# Patient Record
Sex: Male | Born: 1990 | Race: White | Hispanic: No | Marital: Single | Smoking: Never smoker
Health system: Southern US, Community
[De-identification: ages and names within clinical notes are randomized; demographics above are authoritative.]

---
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
## 2011-05-30 ENCOUNTER — Ambulatory Visit (INDEPENDENT_AMBULATORY_CARE_PROVIDER_SITE_OTHER): Payer: BC Managed Care – PPO

## 2011-05-30 DIAGNOSIS — J159 Unspecified bacterial pneumonia: Secondary | ICD-10-CM

## 2011-05-30 DIAGNOSIS — G4700 Insomnia, unspecified: Secondary | ICD-10-CM

## 2014-11-27 ENCOUNTER — Encounter: Payer: Self-pay | Admitting: *Deleted

## 2014-11-27 ENCOUNTER — Telehealth: Payer: Self-pay | Admitting: *Deleted

## 2014-11-27 ENCOUNTER — Emergency Department
Admission: EM | Admit: 2014-11-27 | Discharge: 2014-11-27 | Disposition: A | Discharge status: Home / Self Care | Payer: BC Managed Care – PPO | Source: Home / Self Care | Attending: Family Medicine | Admitting: Family Medicine

## 2014-11-27 DIAGNOSIS — R1310 Dysphagia, unspecified: Secondary | ICD-10-CM

## 2014-11-27 DIAGNOSIS — R1319 Other dysphagia: Secondary | ICD-10-CM

## 2014-11-27 DIAGNOSIS — R1314 Dysphagia, pharyngoesophageal phase: Secondary | ICD-10-CM

## 2014-11-27 MED ORDER — SUCRALFATE 1 GM/10ML PO SUSP: 1.0000 g | Freq: Three times a day (TID) | ORAL | Status: DC

## 2014-11-27 NOTE — Discharge Instructions (Signed)

## 2014-11-27 NOTE — ED Notes (Signed)
Pt c/o dysphagia x 2 weeks. Post nasal drainage x 2 months. Denies SOB or dyspnea.

## 2014-11-27 NOTE — ED Provider Notes (Signed)
CSN: [id number]     Arrival date & time 11/27/14  1108 History   First MD Initiated Contact with Patient 11/27/14 1146     Chief Complaint  Patient presents with  . Dysphagia      HPI Comments: Patient complains of difficulty swallowing solids for about one month.  About two weeks ago he was unsable to initially swallow a piece of steak, but succeeded after drinking some fluids.  Yesterday he had significant difficulty swallowing an almond.  He has no difficulty initiating swallowing, and food seems to become lodged further down.  He states that for about 4 to 5 weeks he has had to frequently "clear his throat," and he has a morning cough and sensation of phlegm in his throat upon awakening.  He denies nausea/vomiting and abdominal pain.  No shortness of breath.  His bowel movements have been normal  The history is provided by the patient.    History reviewed. No pertinent past medical history. History reviewed. No pertinent past surgical history. History reviewed. No pertinent family history. History  Substance Use Topics  . Smoking status: Never Smoker   . Smokeless tobacco: Never Used  . Alcohol Use: Yes    Review of Systems  Constitutional: Negative for fever, chills, diaphoresis, appetite change, fatigue and unexpected weight change.  HENT: Positive for sore throat. Negative for congestion, postnasal drip and sinus pressure.   Eyes: Negative.   Respiratory: Negative.   Cardiovascular: Negative.   Gastrointestinal: Negative for nausea, vomiting, abdominal pain, diarrhea, constipation, blood in stool and abdominal distention.  Genitourinary: Negative.  Negative for penile swelling.  Musculoskeletal: Negative.   Skin: Negative.   Neurological: Negative for headaches.  Hematological: Negative for adenopathy.    Allergies  Review of patient's allergies indicates no known allergies.  Home Medications   Prior to Admission medications   Medication Sig Start Date End Date  Taking? Authorizing Provider  sucralfate (CARAFATE) 1 GM/10ML suspension Take 10 mLs (1 g total) by mouth 4 (four) times daily -  with meals and at bedtime. 11/27/14   Lattie HawStephen A Tanaja Ganger, MD   BP 117/83 mmHg  Pulse 57  Temp(Src) 98.3 F (36.8 C) (Oral)  Resp 14  Ht 5\' 6"  (1.676 m)  Wt 198 lb (89.812 kg)  BMI 31.97 kg/m2  SpO2 98% Physical Exam Nursing notes and Vital Signs reviewed. Appearance:  Patient appears stated age, and in no acute distress Eyes:  Pupils are equal, round, and reactive to light and accomodation.  Extraocular movement is intact.  Conjunctivae are not inflamed  Ears:  Canals normal.  Tympanic membranes normal.  Nose:   Normal turbinates.  No sinus tenderness.    Mouth/Pharynx:  Normal Neck:  Supple.  No adenopathy or thyromegaly Lungs:  Clear to auscultation.  Breath sounds are equal.  Moving air well. Heart:  Regular rate and rhythm without murmurs, rubs, or gallops.  Abdomen:  Nontender without masses or hepatosplenomegaly.  Bowel sounds are present.  No CVA or flank tenderness.  Extremities:  No edema.  No calf tenderness Skin:  No rash present.    ED Course  Procedures  none   MDM   1. Esophageal dysphagia    Begin Carafate 10mL QID (AC and HS) Followup with gastroenterologist for definitive evaluation and treatment.    Lattie HawStephen A Lankford Gutzmer, MD 11/28/14 2351

## 2014-12-03 ENCOUNTER — Other Ambulatory Visit: Payer: Self-pay | Admitting: Physician Assistant

## 2014-12-03 DIAGNOSIS — R1310 Dysphagia, unspecified: Secondary | ICD-10-CM

## 2014-12-05 ENCOUNTER — Ambulatory Visit
Admission: RE | Admit: 2014-12-05 | Discharge: 2014-12-05 | Disposition: A | Discharge status: Home / Self Care | Payer: BC Managed Care – PPO | Source: Ambulatory Visit | Attending: Physician Assistant | Admitting: Physician Assistant

## 2014-12-05 DIAGNOSIS — R1310 Dysphagia, unspecified: Secondary | ICD-10-CM

## 2016-05-05 ENCOUNTER — Encounter: Payer: Self-pay | Admitting: Emergency Medicine

## 2016-05-05 ENCOUNTER — Emergency Department (INDEPENDENT_AMBULATORY_CARE_PROVIDER_SITE_OTHER)
Admission: EM | Admit: 2016-05-05 | Discharge: 2016-05-05 | Disposition: A | Discharge status: Home / Self Care | Payer: BLUE CROSS/BLUE SHIELD | Source: Home / Self Care | Attending: Family Medicine | Admitting: Family Medicine

## 2016-05-05 DIAGNOSIS — J029 Acute pharyngitis, unspecified: Secondary | ICD-10-CM

## 2016-05-05 LAB — POCT RAPID STREP A (OFFICE): Rapid Strep A Screen: NEGATIVE

## 2016-05-05 NOTE — ED Triage Notes (Signed)
Sore throat, fever, chills, headache slight nausea x 3 days

## 2016-05-05 NOTE — ED Provider Notes (Signed)
CSN: [id number]     Arrival date & time 05/05/16  1107 History   First MD Initiated Contact with Patient 05/05/16 1235     Chief Complaint  Patient presents with  . Sore Throat   (Consider location/radiation/quality/duration/timing/severity/associated sxs/prior Treatment) HPI  Shawn Palmer is a 25 y.o. male presenting to UC with c/o 3 days of gradually worsening sore throat with fever, chills, generalized headache and slight nausea.  Sore throat and headache are most bothersome for pt.  Throat pain is 4/10, worse with swallowing.  He has taken acetaminophen with mild temporary relief.  Pt's mother notes he was in a church activity with another girl recently who had the flu.  Pt denies cough or congestion. Denies chest pain or SOB. No hx of asthma or COPD. He did not get the flu vaccine this season.    History reviewed. No pertinent past medical history. History reviewed. No pertinent surgical history. No family history on file. Social History  Substance Use Topics  . Smoking status: Never Smoker  . Smokeless tobacco: Never Used  . Alcohol use Yes    Review of Systems  Constitutional: Positive for chills and fever.  HENT: Positive for sore throat. Negative for congestion, ear pain, trouble swallowing and voice change.   Respiratory: Negative for cough and shortness of breath.   Cardiovascular: Negative for chest pain and palpitations.  Gastrointestinal: Negative for abdominal pain, diarrhea, nausea and vomiting.  Musculoskeletal: Negative for arthralgias, back pain and myalgias.  Skin: Negative for rash.  Neurological: Positive for headaches. Negative for dizziness and light-headedness.    Allergies  Patient has no known allergies.  Home Medications   Prior to Admission medications   Medication Sig Start Date End Date Taking? Authorizing Provider  acetaminophen (TYLENOL) 325 MG tablet Take 650 mg by mouth every 6 (six) hours as needed.   Yes Historical Provider, MD   omeprazole (PRILOSEC) 10 MG capsule Take 10 mg by mouth daily.   Yes Historical Provider, MD  sucralfate (CARAFATE) 1 GM/10ML suspension Take 10 mLs (1 g total) by mouth 4 (four) times daily -  with meals and at bedtime. 11/27/14   Lattie HawStephen A Beese, MD   Meds Ordered and Administered this Visit  Medications - No data to display  BP 116/73 (BP Location: Left Arm)   Pulse 86   Temp 98.3 F (36.8 C) (Oral)   Ht 5\' 6"  (1.676 m)   Wt 206 lb (93.4 kg)   SpO2 99%   BMI 33.25 kg/m  No data found.   Physical Exam  Constitutional: He appears well-developed and well-nourished. No distress.  HENT:  Head: Normocephalic and atraumatic.  Right Ear: Tympanic membrane normal.  Left Ear: Tympanic membrane normal.  Nose: Nose normal.  Mouth/Throat: Uvula is midline and mucous membranes are normal. Posterior oropharyngeal erythema present. No oropharyngeal exudate, posterior oropharyngeal edema or tonsillar abscesses.  Eyes: Conjunctivae are normal. No scleral icterus.  Neck: Normal range of motion. Neck supple.  Cardiovascular: Normal rate, regular rhythm and normal heart sounds.   Pulmonary/Chest: Effort normal and breath sounds normal. No stridor. No respiratory distress. He has no wheezes. He has no rales.  Musculoskeletal: Normal range of motion.  Lymphadenopathy:    He has no cervical adenopathy.  Neurological: He is alert.  Skin: Skin is warm and dry. He is not diaphoretic.  Nursing note and vitals reviewed.   Urgent Care Course   Clinical Course     Procedures (including critical care time)  Labs Review Labs Reviewed  POCT RAPID STREP A (OFFICE)    Imaging Review No results found.   MDM   1. Pharyngitis, unspecified etiology    Pt c/o sore throat, headache and chills. Possible exposure to the flu.  Rapid strep: Negative Will send culture.  Discussed pt being outside the recommended 48 hours for treatment with tamiflu.  Symptoms likely viral. Encouraged symptomatic  treatment with plenty of fluids and rest. F/u with PCP in 1 week if not improving, sooner if worsening. Work note provided for today and tomorrow.    Junius Finnerrin O'Malley, PA-C 05/05/16 1408

## 2016-08-14 IMAGING — RF DG ESOPHAGUS
16 of 19 series · 19 of 24 positions shown · non-contrast
Comparison: None.

CLINICAL DATA: Dysphagia

EXAM:
ESOPHOGRAM / BARIUM SWALLOW / BARIUM TABLET STUDY
TECHNIQUE: Combined double contrast and single contrast examination performed
using effervescent crystals, thick barium liquid, and thin barium
liquid. The patient was observed with fluoroscopy swallowing a 13 mm
barium sulphate tablet.
FLUOROSCOPY TIME:  Radiation Exposure Index (as provided by the
fluoroscopic device): 40 Gy per sq cm
If the device does not provide the exposure index:
Fluoroscopy Time:  1 minutes 30 seconds
Number of Acquired Images:

[Series 2: run · 1 of 1 slices shown (1 of 16)]
[im 1/1]
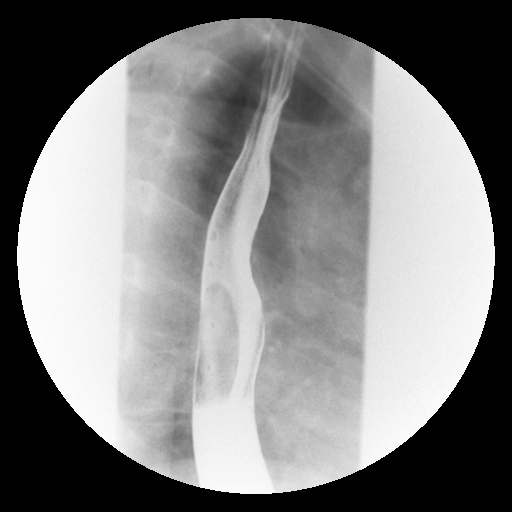

[Series 3: run · 1 of 1 slices shown (2 of 16)]
[im 1/1]
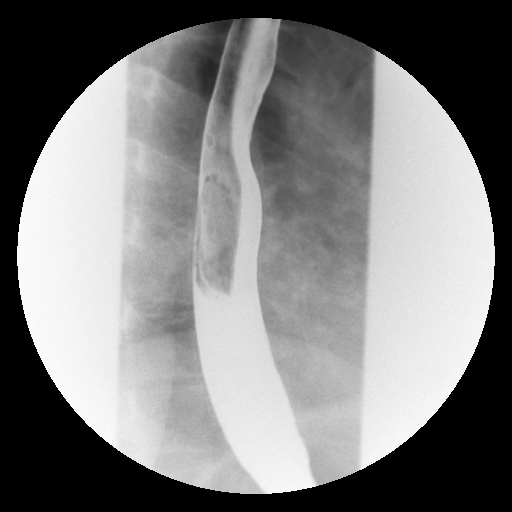

[Series 6: run · 1 of 1 slices shown (3 of 16)]
[im 1/1]
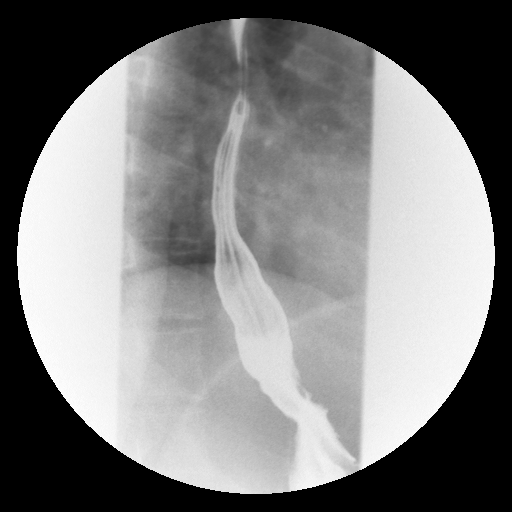

[Series 7: run · 1 of 1 slices shown (4 of 16)]
[im 1/1]
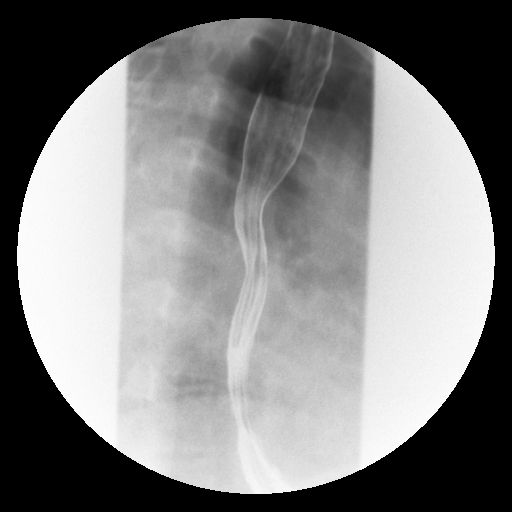

[Series 8: run · 2 of 9 slices shown (5 of 16)]
[im 1/9]
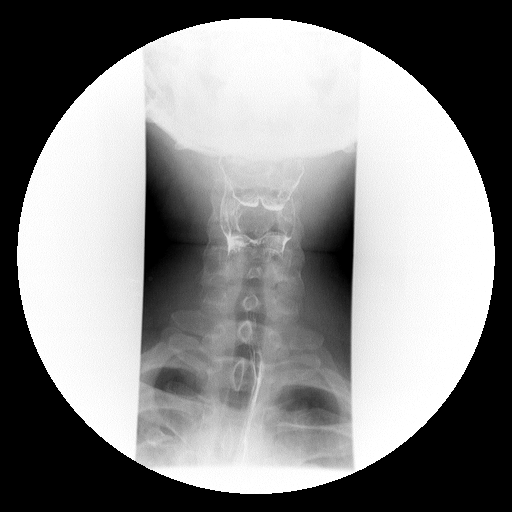
[im 5/9]
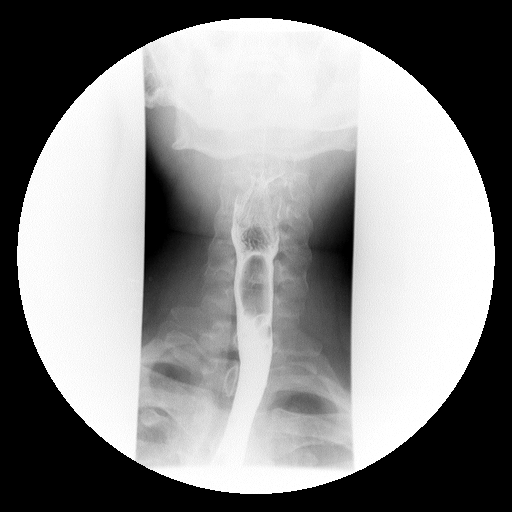

[Series 9: run · 3 of 10 slices shown (6 of 16)]
[im 1/10]
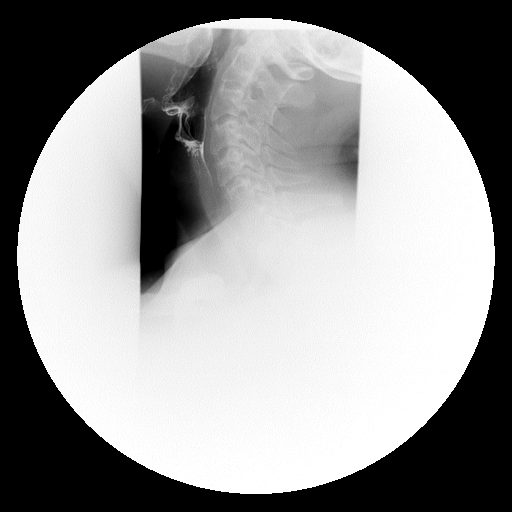
[im 4/10]
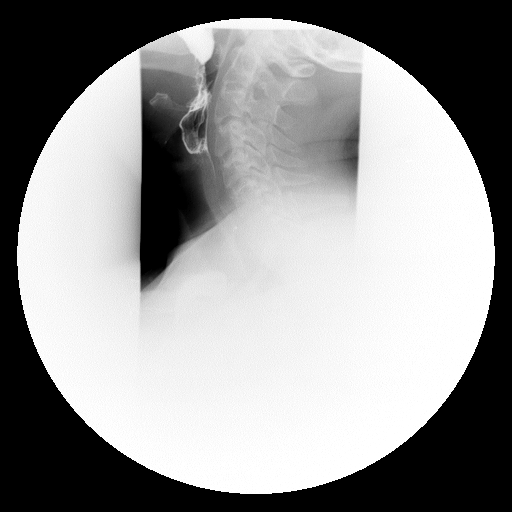
[im 7/10]
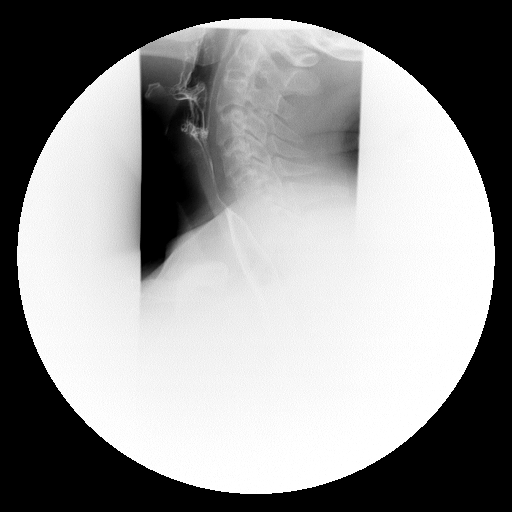

[Series 10: run · 1 of 1 slices shown (7 of 16)]
[im 1/1]
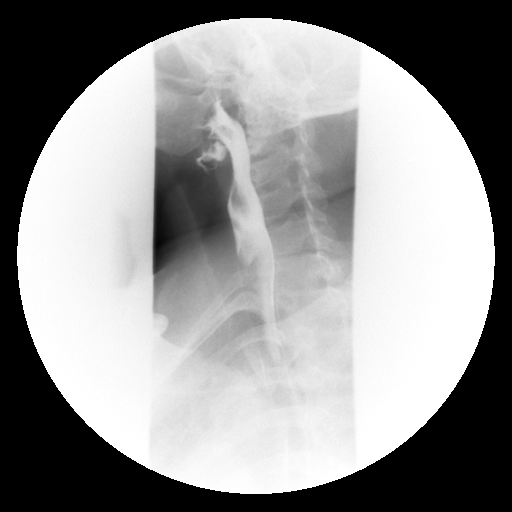

[Series 12: run · 1 of 1 slices shown (8 of 16)]
[im 1/1]
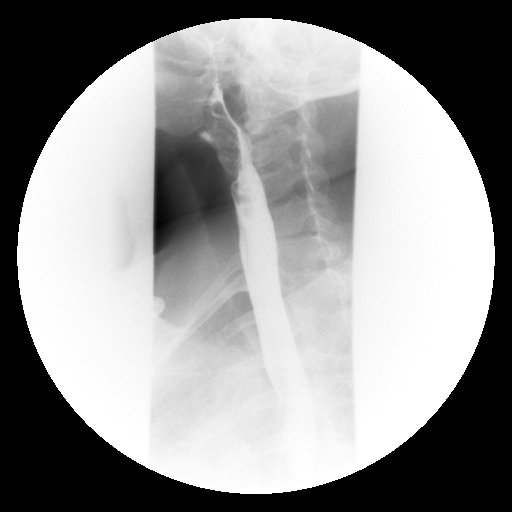

[Series 13: run · 1 of 1 slices shown (9 of 16)]
[im 1/1]
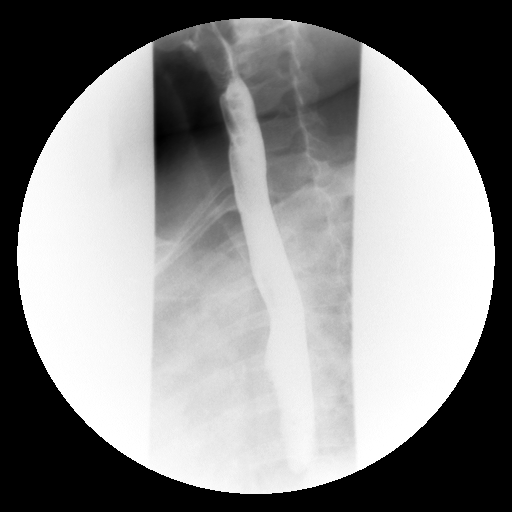

[Series 14: run · 1 of 1 slices shown (10 of 16)]
[im 1/1]
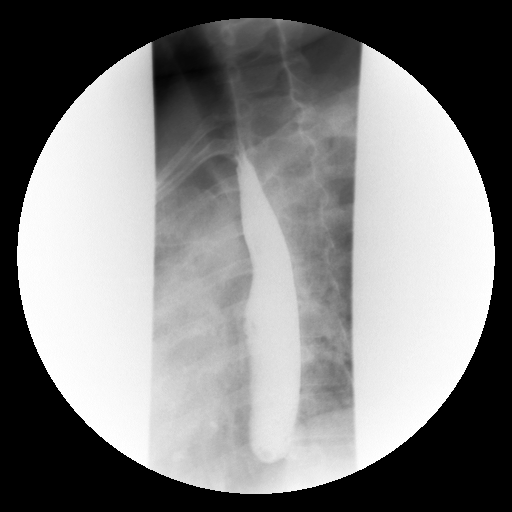

[Series 16: run · 1 of 1 slices shown (11 of 16)]
[im 1/1]
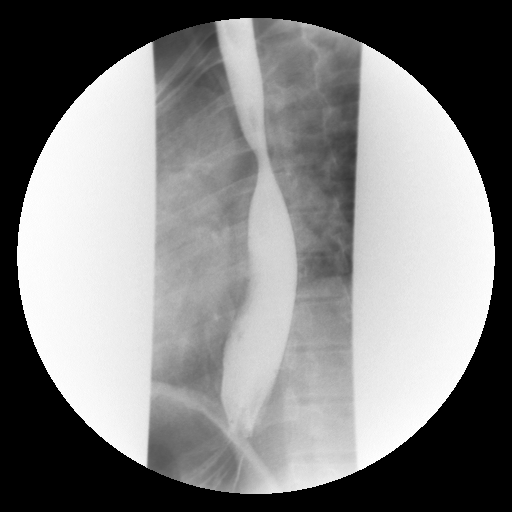

[Series 18: run · 1 of 1 slices shown (12 of 16)]
[im 1/1]
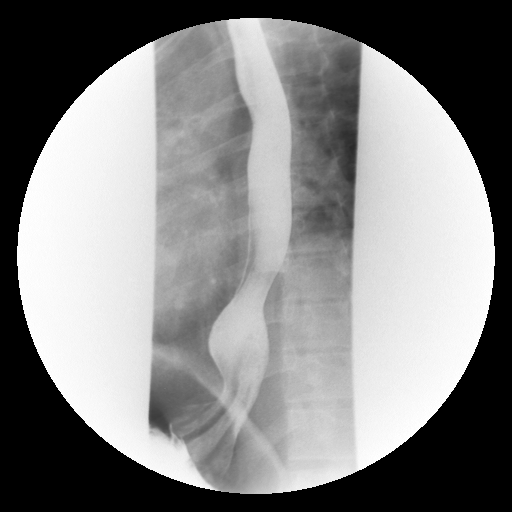

[Series 19: run · 1 of 1 slices shown (13 of 16)]
[im 1/1]
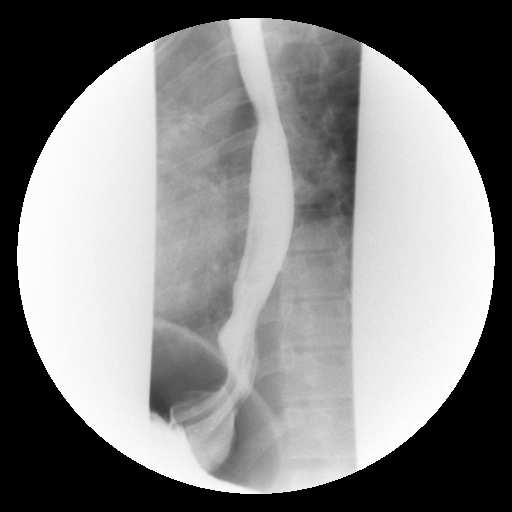

[Series 20: run · 1 of 1 slices shown (14 of 16)]
[im 1/1]
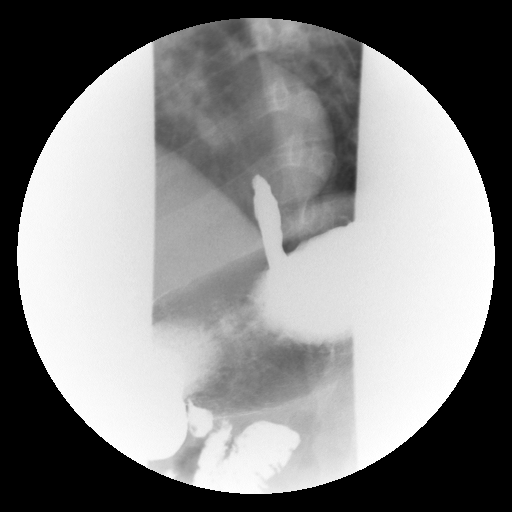

[Series 22: run · 1 of 1 slices shown (15 of 16)]
[im 1/1]
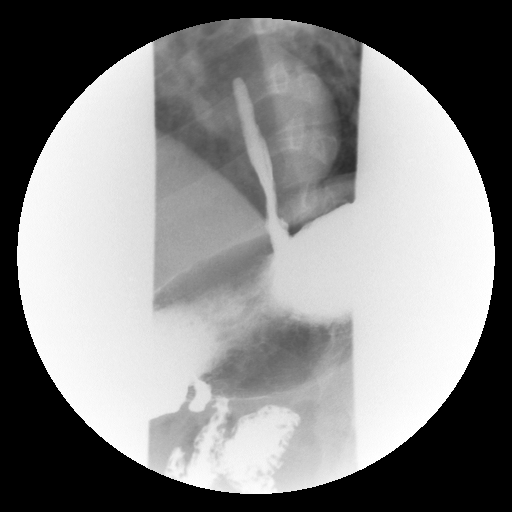

[Series 23: run · 1 of 1 slices shown (16 of 16)]
[im 1/1]
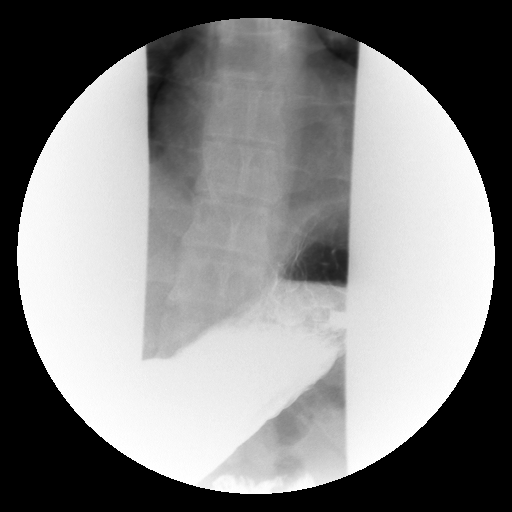

[19 of 24 positions shown; findings below may reference images not displayed]

FINDINGS: A double-contrast barium swallow was performed. The mucosa of the
esophagus is unremarkable. A single contrast study shows the
swallowing mechanism to be normal. Esophageal peristalsis is normal.
No hiatal hernia is seen. However moderate gastroesophageal reflux
is demonstrated. A barium pill was given at the end of the study
which passed into the stomach without delay.
IMPRESSION: Moderate gastroesophageal reflux.  No hiatal hernia.

## 2017-11-27 ENCOUNTER — Other Ambulatory Visit: Payer: Self-pay

## 2017-11-27 ENCOUNTER — Emergency Department (INDEPENDENT_AMBULATORY_CARE_PROVIDER_SITE_OTHER)
Admission: EM | Admit: 2017-11-27 | Discharge: 2017-11-27 | Disposition: A | Discharge status: Home / Self Care | Payer: BLUE CROSS/BLUE SHIELD | Source: Home / Self Care | Attending: Family Medicine | Admitting: Family Medicine

## 2017-11-27 ENCOUNTER — Encounter: Payer: Self-pay | Admitting: Emergency Medicine

## 2017-11-27 DIAGNOSIS — S61412A Laceration without foreign body of left hand, initial encounter: Secondary | ICD-10-CM

## 2017-11-27 DIAGNOSIS — L03114 Cellulitis of left upper limb: Secondary | ICD-10-CM

## 2017-11-27 DIAGNOSIS — Z23 Encounter for immunization: Secondary | ICD-10-CM | POA: Diagnosis not present

## 2017-11-27 MED ORDER — DOXYCYCLINE HYCLATE 100 MG PO CAPS: 100.0000 mg | ORAL_CAPSULE | Freq: Two times a day (BID) | ORAL | 0 refills | Status: AC

## 2017-11-27 MED ORDER — TETANUS-DIPHTH-ACELL PERTUSSIS 5-2.5-18.5 LF-MCG/0.5 IM SUSP
0.5000 mL | Freq: Once | INTRAMUSCULAR | Status: AC
  Administered 2017-11-27: 0.5 mL via INTRAMUSCULAR

## 2017-11-27 NOTE — ED Triage Notes (Signed)
Patient reports cutting web area between finger 3/4 on left hand 2 days ago on a bed frame. He has not had a tdap in less than 6 years.

## 2017-11-27 NOTE — Discharge Instructions (Addendum)
Keep wound clean and dry.  Return for any signs of infection (or follow-up with family doctor):  Increasing redness, swelling, pain, heat, drainage, etc. °Follow instructions on Dermabond information sheet.  °

## 2017-11-27 NOTE — ED Provider Notes (Signed)
[facility]    CSN: [id number] Arrival date & time: 11/27/17  0948     History   Chief Complaint Chief Complaint  Patient presents with  . Hand Injury    left    HPI Shawn Palmer is a 27 y.o. male.   Patient lacerated the web space between his left 2nd and 3rd fingers 2 nights ago.  The area is becoming more sore.  He does not remember his last Tdap.  The history is provided by the patient.  Laceration  Location:  Hand Hand laceration location:  L hand Length:  8mm Depth:  Through dermis Quality: straight   Bleeding: controlled   Time since incident:  2 days Laceration mechanism:  Metal edge Pain details:    Quality:  Aching and dull   Severity:  Mild   Timing:  Intermittent   Progression:  Worsening Foreign body present:  No foreign bodies Relieved by:  Nothing Worsened by:  Movement Ineffective treatments:  None tried Tetanus status:  Out of date Associated symptoms: redness   Associated symptoms: no swelling     History reviewed. No pertinent past medical history.  There are no active problems to display for this patient.   History reviewed. No pertinent surgical history.     Home Medications    Prior to Admission medications   Medication Sig Start Date End Date Taking? Authorizing Provider  acetaminophen (TYLENOL) 325 MG tablet Take 650 mg by mouth every 6 (six) hours as needed.    [provider]  doxycycline (VIBRAMYCIN) 100 MG capsule Take 1 capsule (100 mg total) by mouth 2 (two) times daily. Take with food. 11/27/17   Lattie HawBeese, Moriyah Byington A, MD  omeprazole (PRILOSEC) 10 MG capsule Take 10 mg by mouth daily.    [provider]    Family History No family history on file.  Social History Social History   Tobacco Use  . Smoking status: Never Smoker  . Smokeless tobacco: Never Used  Substance Use Topics  . Alcohol use: Yes  . Drug use: No     Allergies   Patient has no known allergies.   Review of  Systems Review of Systems  All other systems reviewed and are negative.    Physical Exam Triage Vital Signs ED Triage Vitals  Enc Vitals Group     BP 11/27/17 1017 133/83     Pulse Rate 11/27/17 1017 76     Resp 11/27/17 1017 16     Temp 11/27/17 1017 97.9 F (36.6 C)     Temp Source 11/27/17 1017 Oral     SpO2 11/27/17 1017 96 %     Weight 11/27/17 1018 230 lb (104.3 kg)     Height 11/27/17 1018 5\' 6"  (1.676 m)     Head Circumference --      Peak Flow --      Pain Score 11/27/17 1017 2     Pain Loc --      Pain Edu? --      Excl. in GC? --    No data found.  Updated Vital Signs BP 133/83 (BP Location: Right Arm)   Pulse 76   Temp 97.9 F (36.6 C) (Oral)   Resp 16   Ht 5\' 6"  (1.676 m)   Wt 230 lb (104.3 kg)   SpO2 96%   BMI 37.12 kg/m   Visual Acuity Right Eye Distance:   Left Eye Distance:   Bilateral Distance:    Right  Eye Near:   Left Eye Near:    Bilateral Near:     Physical Exam  Constitutional: He appears well-developed and well-nourished. No distress.  HENT:  Head: Atraumatic.  Eyes: Pupils are equal, round, and reactive to light.  Cardiovascular: Normal rate.  Pulmonary/Chest: Effort normal.  Musculoskeletal: Normal range of motion.       Left hand: He exhibits laceration. He exhibits normal range of motion and no swelling.       Hands: In the web space between 2nd and 3rd fingers is a very shallow laceration about 8mm long with mild surrounding erythema.  Fingers have full range of motion.  Neurological: He is alert.  Skin: Skin is warm and dry.  Nursing note and vitals reviewed.    UC Treatments / Results  Labs (all labs ordered are listed, but only abnormal results are displayed) Labs Reviewed - No data to display  EKG None  Radiology No results found.  Procedures Procedures  Laceration Repair (Dermabond) Discussed benefits and risks of procedure and verbal consent obtained. Using sterile technique, cleansed wound with  Betadine followed by copious lavage with normal saline.  Wound carefully inspected for debris and foreign bodies; none found.  Applied Dermabond to seal wound.  Wound precautions explained to patient.     Medications Ordered in UC Medications  Tdap (BOOSTRIX) injection 0.5 mL (0.5 mLs Intramuscular Given 11/27/17 1020)    Initial Impression / Assessment and Plan / UC Course  I have reviewed the triage vital signs and the nursing notes.  Pertinent labs & imaging results that were available during my care of the patient were reviewed by me and considered in my medical decision making (see chart for details).    Administered Tdap  Begin empiric doxycycline for staph coverage.     Final Clinical Impressions(s) / UC Diagnoses   Final diagnoses:  Laceration of left hand without foreign body, initial encounter  Cellulitis of left hand     Discharge Instructions     Keep wound clean and dry.  Return for any signs of infection (or follow-up with family doctor):  Increasing redness, swelling, pain, heat, drainage, etc. Follow instructions on Dermabond information sheet.    ED Prescriptions    Medication Sig Dispense Auth. Provider   doxycycline (VIBRAMYCIN) 100 MG capsule Take 1 capsule (100 mg total) by mouth 2 (two) times daily. Take with food. 14 capsule Lattie Haw, MD         Lattie Haw, MD 11/27/17 1034
